# Patient Record
Sex: Male | Born: 1980 | Race: Black or African American | Hispanic: No | Marital: Married | Smoking: Never smoker
Health system: Southern US, Community
[De-identification: ages and names within clinical notes are randomized; demographics above are authoritative.]

---

## 2012-10-23 ENCOUNTER — Ambulatory Visit (INDEPENDENT_AMBULATORY_CARE_PROVIDER_SITE_OTHER): Payer: PRIVATE HEALTH INSURANCE | Admitting: Emergency Medicine

## 2012-10-23 VITALS — BP 134/82 | HR 103 | Temp 101.2°F | Resp 16 | Ht 70.5 in | Wt 187.0 lb

## 2012-10-23 DIAGNOSIS — A088 Other specified intestinal infections: Secondary | ICD-10-CM

## 2012-10-23 LAB — POCT CBC
Granulocyte percent: 79.2 %G (ref 37–80)
MCH, POC: 28 pg (ref 27–31.2)
MID (cbc): 0.5 (ref 0–0.9)
MPV: 7.3 fL (ref 0–99.8)
POC MID %: 7.5 %M (ref 0–12)
Platelet Count, POC: 241 10*3/uL (ref 142–424)
RBC: 4.92 M/uL (ref 4.69–6.13)
RDW, POC: 12.9 %

## 2012-10-23 MED ORDER — ONDANSETRON 8 MG PO TBDP: 8.0000 mg | ORAL_TABLET | Freq: Three times a day (TID) | ORAL | Status: AC | PRN

## 2012-10-23 NOTE — Progress Notes (Signed)
[facility] [address], [place] [address] [phone]  Date:  10/23/2012   Name:  Steven Hays   DOB:  07-May-1981   MRN:  [id number]  PCP:  No primary provider on file.    Chief Complaint: Illness   History of Present Illness:  Steven Hays is a 32 y.o. very pleasant male patient who presents with the following:  Ill this afternoon with chills and fever and a sensation that he is going to move his bowels and have diarrhea and nausea.  No vomiting.  Wife was ill with same last week.  No rash. No cough or coryza.  No GU symptoms.  No improvement with over the counter medications or other home remedies. Denies other complaint or health concern today.   There is no problem list on file for this patient.   History reviewed. No pertinent past medical history.  History reviewed. No pertinent past surgical history.  History  Substance Use Topics  . Smoking status: Never Smoker   . Smokeless tobacco: Not on file  . Alcohol Use: No    Family History  Problem Relation Age of Onset  . Hypertension Father     No Known Allergies  Medication list has been reviewed and updated.  No current outpatient prescriptions on file prior to visit.   No current facility-administered medications on file prior to visit.    Review of Systems:  As per HPI, otherwise negative.    Physical Examination: Filed Vitals:   10/23/12 2033  BP: 134/82  Pulse: 103  Temp: 101.2 F (38.4 C)  Resp: 16   Filed Vitals:   10/23/12 2033  Height: 5' 10.5" (1.791 m)  Weight: 187 lb (84.823 kg)   Body mass index is 26.44 kg/(m^2). Ideal Body Weight: Weight in (lb) to have BMI = 25: 176.4  GEN: WDWN, NAD, Non-toxic, A & O x 3 HEENT: Atraumatic, Normocephalic. Neck supple. No masses, No LAD. Ears and Nose: No external deformity. CV: RRR, No M/G/R. No JVD. No thrill. No extra heart sounds. PULM: CTA B, no wheezes, crackles, rhonchi. No retractions. No resp. distress.  No accessory muscle use. ABD: S, NT, ND, +BS. No rebound. No HSM. EXTR: No c/c/e NEURO Normal gait.  PSYCH: Normally interactive. Conversant. Not depressed or anxious appearing.  Calm demeanor.    Assessment and Plan: Gastroenteritis Imodium zofran  Signed,  Phillips Odor, MD   Results for orders placed in visit on 10/23/12  POCT CBC      Result Value Range   WBC 6.2  4.6 - 10.2 K/uL   Lymph, poc 0.8  0.6 - 3.4   POC LYMPH PERCENT 13.3  10 - 50 %L   MID (cbc) 0.5  0 - 0.9   POC MID % 7.5  0 - 12 %M   POC Granulocyte 4.9  2 - 6.9   Granulocyte percent 79.2  37 - 80 %G   RBC 4.92  4.69 - 6.13 M/uL   Hemoglobin 13.8 (*) 14.1 - 18.1 g/dL   HCT, POC 29.5 (*) 62.1 - 53.7 %   MCV 86.1  80 - 97 fL   MCH, POC 28.0  27 - 31.2 pg   MCHC 32.5  31.8 - 35.4 g/dL   RDW, POC 30.8     Platelet Count, POC 241  142 - 424 K/uL   MPV 7.3  0 - 99.8 fL

## 2012-10-23 NOTE — Patient Instructions (Signed)
Clear Liquid Diet °The clear liquid diet consists of foods that are liquid or will become liquid at room temperature. You should be able to see through the liquid and beverages. Examples of foods allowed on a clear liquid diet include fruit juice, broth or bouillon, gelatin, or frozen ice pops. °The purpose of this diet is to provide necessary fluid, electrolytes such as sodium and potassium, and energy to keep the body functioning during times when you are not able to consume a regular diet. A clear liquid diet should not be continued for long periods of time as it is not nutritionally adequate.  °REASONS FOR USING A CLEAR LIQUID DIET °· In sudden onset (acute) conditions for a patient before or after surgery. °· As the first step in oral feeding. °· For fluid and electrolyte replacement in diarrheal diseases. °· As a diet before certain medical tests are performed. °ADEQUACY °The clear liquid diet is adequate only in ascorbic acid, according to the Recommended Dietary Allowances of the National Research Council. °CHOOSING FOODS °Breads and Starches °· Allowed:  None are allowed. °· Avoid: All are avoided. °Vegetables °· Allowed:  Strained tomato or vegetable juice. °· Avoid: Any others. °Fruit °· Allowed:  Strained fruit juices and fruit drinks. Include 1 serving of citrus or vitamin C-enriched fruit juice daily. °· Avoid: Any others. °Meat and Meat Substitutes °· Allowed:  None are allowed. °· Avoid: All are avoided. °Milk °· Allowed:  None are allowed. °· Avoid: All are avoided. °Soups and Combination Foods °· Allowed:  Clear bouillon, broth, or strained broth-based soups. °· Avoid: Any others. °Desserts and Sweets °· Allowed:  Sugar, honey. High protein gelatin. Flavored gelatin, ices, or frozen ice pops that do not contain milk. °· Avoid: Any others. °Fats and Oils °· Allowed:  None are allowed. °· Avoid: All are avoided. °Beverages °· Allowed: Cereal beverages, coffee (regular or decaffeinated), tea, or soda  at the discretion of your caregiver. °· Avoid: Any others. °Condiments °· Allowed:  Iodized salt. °· Avoid: Any others, including pepper. °Supplements °· Allowed:  Liquid nutrition beverages. °· Avoid: Any others that contain lactose or fiber. °SAMPLE MEAL PLAN °Breakfast °· 4 oz (120 mL) strained orange juice. °· ½ to 1 cup (125 to 250 mL) gelatin (plain or fortified). °· 1 cup (250 mL) beverage (coffee or tea). °· Sugar, if desired. °Midmorning Snack °· ½ cup (125 mL) gelatin (plain or fortified). °Lunch °· 1 cup (250 mL) broth or consommé. °· 4 oz (120 mL) strained grapefruit juice. °· ½ cup (125 mL) gelatin (plain or fortified). °· 1 cup (250 mL) beverage (coffee or tea). °· Sugar, if desired. °Midafternoon Snack °· ½ cup (125 mL) fruit ice. °· ½ cup (125 mL) strained fruit juice. °Dinner °· 1 cup (250 mL) broth or consommé. °· ½ cup (125 mL) cranberry juice. °· ½ cup (125 mL) flavored gelatin (plain or fortified). °· 1 cup (250 mL) beverage (coffee or tea). °· Sugar, if desired. °Evening Snack °· 4 oz (120 mL) strained apple juice (vitamin C-fortified). °· ½ cup (125 mL) flavored gelatin (plain or fortified). °Document Released: 07/25/2005 Document Revised: 10/17/2011 Document Reviewed: 10/22/2010 °[employer]® Patient Information ©2013 [employer]. ° °

## 2013-05-28 ENCOUNTER — Other Ambulatory Visit: Payer: Self-pay | Admitting: Family Medicine

## 2013-05-28 DIAGNOSIS — E010 Iodine-deficiency related diffuse (endemic) goiter: Secondary | ICD-10-CM

## 2013-06-05 ENCOUNTER — Other Ambulatory Visit: Payer: Self-pay

## 2013-06-12 ENCOUNTER — Other Ambulatory Visit: Payer: Self-pay

## 2014-06-10 ENCOUNTER — Other Ambulatory Visit: Payer: Self-pay | Admitting: Emergency Medicine

## 2014-06-10 LAB — MEASLES/MUMPS/RUBELLA IMMUNITY
Mumps IgG: 239 AU/mL — ABNORMAL HIGH (ref <9.00)
RUBELLA: 7.93 {index} — AB (ref <0.90)
Rubeola IgG: >300 AU/mL — ABNORMAL HIGH (ref <25.00)

## 2014-06-10 LAB — VARICELLA ZOSTER ANTIBODY, IGG: VARICELLA IGG: 520.7 {index} — AB (ref <135.00)

## 2014-06-19 ENCOUNTER — Encounter: Payer: Self-pay | Admitting: *Deleted

## 2015-07-09 ENCOUNTER — Other Ambulatory Visit: Payer: Self-pay | Admitting: Family

## 2015-07-09 DIAGNOSIS — G4489 Other headache syndrome: Secondary | ICD-10-CM

## 2015-07-22 ENCOUNTER — Ambulatory Visit
Admission: RE | Admit: 2015-07-22 | Discharge: 2015-07-22 | Disposition: A | Discharge status: Home / Self Care | Payer: 59 | Source: Ambulatory Visit | Attending: Family | Admitting: Family

## 2015-07-22 DIAGNOSIS — G4489 Other headache syndrome: Secondary | ICD-10-CM

## 2015-08-31 ENCOUNTER — Ambulatory Visit: Payer: 59 | Admitting: Neurology

## 2015-09-28 ENCOUNTER — Ambulatory Visit: Payer: 59 | Admitting: Neurology

## 2018-06-21 ENCOUNTER — Ambulatory Visit (HOSPITAL_COMMUNITY)
Admission: EM | Admit: 2018-06-21 | Discharge: 2018-06-21 | Disposition: A | Discharge status: Home / Self Care | Payer: PRIVATE HEALTH INSURANCE | Attending: Family Medicine | Admitting: Family Medicine

## 2018-06-21 ENCOUNTER — Ambulatory Visit (INDEPENDENT_AMBULATORY_CARE_PROVIDER_SITE_OTHER): Payer: PRIVATE HEALTH INSURANCE

## 2018-06-21 ENCOUNTER — Encounter (HOSPITAL_COMMUNITY): Payer: Self-pay | Admitting: Emergency Medicine

## 2018-06-21 DIAGNOSIS — R0602 Shortness of breath: Secondary | ICD-10-CM

## 2018-06-21 MED ORDER — HYDROXYZINE HCL 25 MG PO TABS: 25.0000 mg | ORAL_TABLET | Freq: Every day | ORAL | 0 refills | Status: AC

## 2018-06-21 MED ORDER — OMEPRAZOLE 20 MG PO CPDR: 20.0000 mg | DELAYED_RELEASE_CAPSULE | Freq: Every day | ORAL | 0 refills | Status: AC

## 2018-06-21 NOTE — ED Triage Notes (Signed)
Pt c/o sob x1 month. Denies hx of allergies, asthma.

## 2018-06-21 NOTE — ED Provider Notes (Signed)
[facility]    CSN: [id number] Arrival date & time: 06/21/18  1859     History   Chief Complaint Chief Complaint  Patient presents with  . Shortness of Breath    HPI Steven Hays is a 37 y.o. male.   Steven Hays presents with complaints of shortness of breath for the past month. Was intermittent but has become more persistent. Worse if lays flat or on his side, but then once he is asleep he will sleep laying flat without difficulty. No cough. Feels he needs to take a deep breath. No specific back or chest pain although sometimes notices some sternal pain. No URI symptoms. No fevers. Has been able to work out without difficulty, doesn't become shortness of breath. No palpitations. No dizziness. Hasn't taken any medications for symptoms. No history of asthma. Doesn't smoke. Denies any previous similar. No leg pain or swelling, no edema. He take Steven Hays 100mg  bid for folliculitis prescribed by derm. Without contributing medical history.      ROS per HPI.      History reviewed. No pertinent past medical history.  There are no active problems to display for this patient.   History reviewed. No pertinent surgical history.     Home Medications    Prior to Admission medications   Medication Sig Start Date End Date Taking? Authorizing Provider  Steven Hays (VIBRAMYCIN) 100 MG capsule Take 100 mg by mouth 2 (two) times daily.   Yes [provider]  hydrOXYzine (ATARAX/VISTARIL) 25 MG tablet Take 1 tablet (25 mg total) by mouth at bedtime. 06/21/18   Georgetta HaberBurky, Kyri Dai B, NP  omeprazole (PRILOSEC) 20 MG capsule Take 1 capsule (20 mg total) by mouth daily. 06/21/18   Georgetta HaberBurky, Beryle Zeitz B, NP  ondansetron (ZOFRAN-ODT) 8 MG disintegrating tablet Take 1 tablet (8 mg total) by mouth every 8 (eight) hours as needed for nausea. Patient not taking: Reported on 06/21/2018 10/23/12   Carmelina DaneAnderson, Jeffery S, MD    Family History Family History  Problem Relation Age of Onset  .  Hypertension Father     Social History Social History   Tobacco Use  . Smoking status: Never Smoker  Substance Use Topics  . Alcohol use: No  . Drug use: No     Allergies   Patient has no known allergies.   Review of Systems Review of Systems   Physical Exam Triage Vital Signs ED Triage Vitals  Enc Vitals Group     BP 06/21/18 1946 (!) 150/86     Pulse Rate 06/21/18 1946 80     Resp 06/21/18 1946 16     Temp 06/21/18 1946 98 F (36.7 C)     Temp src --      SpO2 06/21/18 1946 100 %     Weight --      Height --      Head Circumference --      Peak Flow --      Pain Score 06/21/18 1948 0     Pain Loc --      Pain Edu? --      Excl. in GC? --    No data found.  Updated Vital Signs BP (!) 150/86   Pulse 80   Temp 98 F (36.7 C)   Resp 16   SpO2 100%    Physical Exam  Constitutional: He is oriented to person, place, and time. He appears well-developed and well-nourished.  Cardiovascular: Normal rate, regular rhythm, normal heart sounds and normal pulses.  Pulmonary/Chest: Effort normal and breath sounds normal.  Lymphadenopathy:  No peripheral edema   Neurological: He is alert and oriented to person, place, and time.  Skin: Skin is warm and dry.   EKG NSR Rate of 71. Abnormal stnoted to v1 btu without any acute changes   UC Treatments / Results  Labs (all labs ordered are listed, but only abnormal results are displayed) Labs Reviewed - No data to display  EKG None  Radiology Dg Chest 2 View  Result Date: 06/21/2018 CLINICAL DATA:  Short of breath EXAM: CHEST - 2 VIEW COMPARISON:  None. FINDINGS: The heart size and mediastinal contours are within normal limits. Both lungs are clear. The visualized skeletal structures are unremarkable. IMPRESSION: No active cardiopulmonary disease. Electronically Signed   By: Marlan Palau M.D.   On: 06/21/2018 20:33    Procedures Procedures (including critical care time)  Medications Ordered in  UC Medications - No data to display  Initial Impression / Assessment and Plan / UC Course  I have reviewed the triage vital signs and the nursing notes.  Pertinent labs & imaging results that were available during my care of the patient were reviewed by me and considered in my medical decision making (see chart for details).     Non toxic, no increased work of breathing. No cardiac or PE risk factors. No tachycardia or hypoxia. Symptoms for 1 month. Exercising without difficulty which is reassuring. Worse with laying flat and occasional sternal pain. Anxiety vs gerd considered in differentials. Will start with daily omeprazole and use of vistaril at night. Recommend follow up with PCP to establish care and for follow up. Return precautions provided. Patient verbalized understanding and agreeable to plan.  Ambulatory out of clinic without difficulty.   Final Clinical Impressions(s) / UC Diagnoses   Final diagnoses:  SOB (shortness of breath)     Discharge Instructions     Your ekg and chest xray are reassuring here tonight.  I have low suspicion of heart or lung etiology.  I still would like you to follow up with a primary care provider for recheck in the next two weeks especially if symptoms persist.  Reflux and/or anxiety can both contribute so similar symptoms. Please start daily omeprazole. May use hydroxyzine at night.  If develop worsening of shortness of breath , chest pain, difficulty breathing, fevers, or otherwise worsening please return or go to the Er.     ED Prescriptions    Medication Sig Dispense Auth. Provider   omeprazole (PRILOSEC) 20 MG capsule Take 1 capsule (20 mg total) by mouth daily. 30 capsule Linus Mako B, NP   hydrOXYzine (ATARAX/VISTARIL) 25 MG tablet Take 1 tablet (25 mg total) by mouth at bedtime. 12 tablet Georgetta Haber, NP     Controlled Substance Prescriptions [place] Controlled Substance Registry consulted? Not Applicable   Georgetta Haber,  NP 06/21/18 2038

## 2018-06-21 NOTE — Discharge Instructions (Signed)
Your ekg and chest xray are reassuring here tonight.  I have low suspicion of heart or lung etiology.  I still would like you to follow up with a primary care provider for recheck in the next two weeks especially if symptoms persist.  Reflux and/or anxiety can both contribute so similar symptoms. Please start daily omeprazole. May use hydroxyzine at night.  If develop worsening of shortness of breath , chest pain, difficulty breathing, fevers, or otherwise worsening please return or go to the Er.

## 2020-03-30 IMAGING — DX DG CHEST 2V
2 series · 2 of 2 positions shown · non-contrast
Comparison: None.

CLINICAL DATA: Short of breath

EXAM:
CHEST - 2 VIEW

[chest pa]
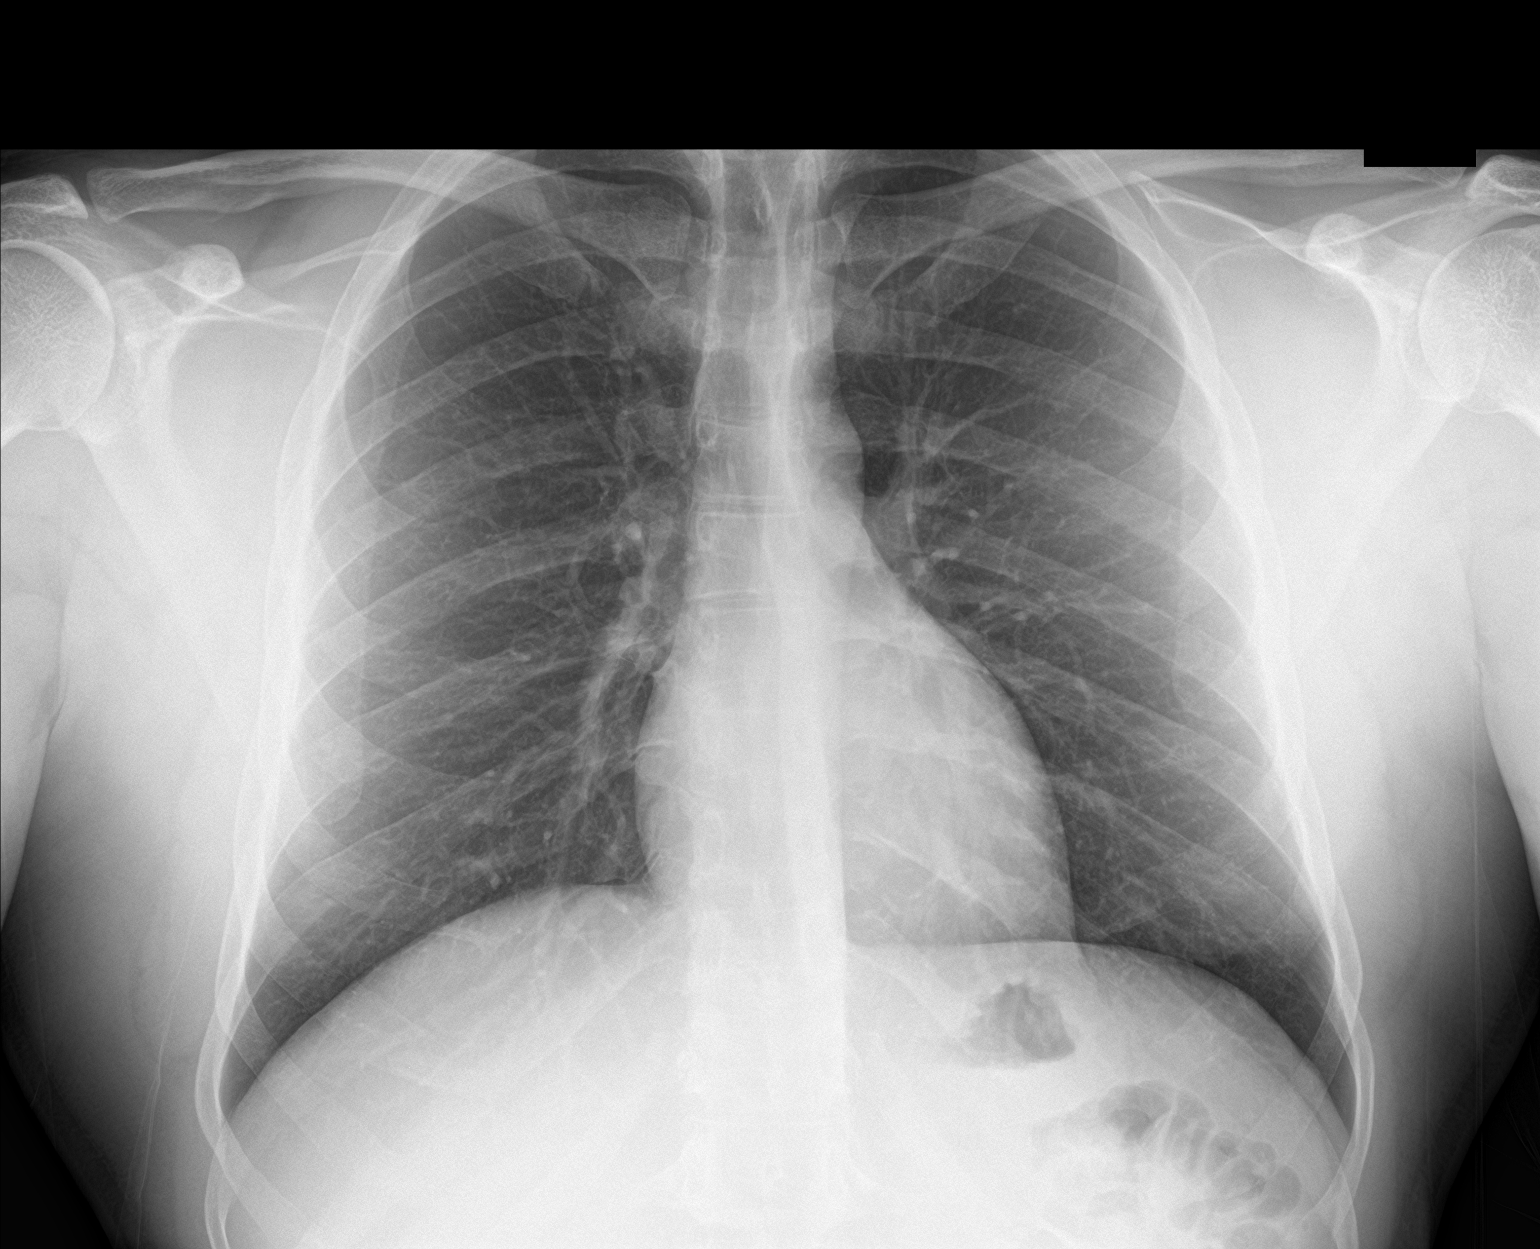

[chest lat]
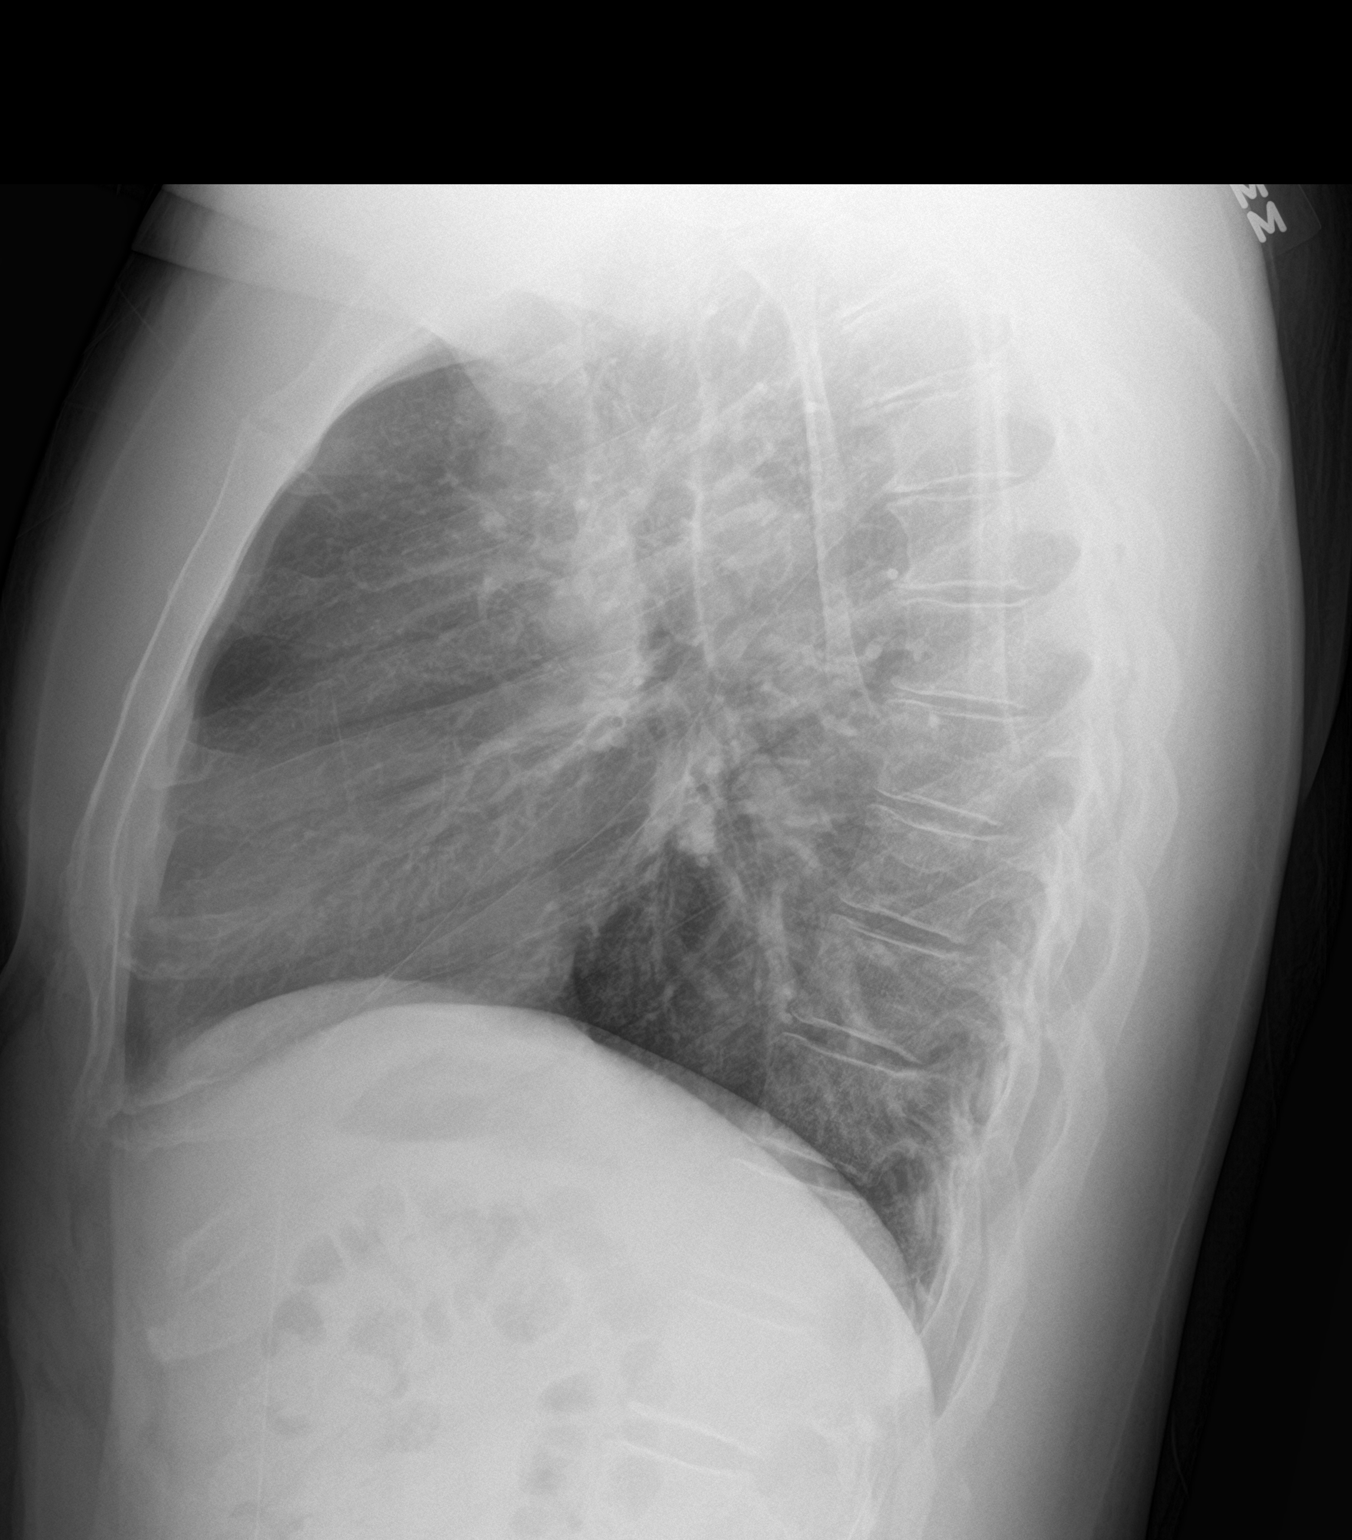

[2 of 2 positions shown; findings below may reference images not displayed]

FINDINGS: The heart size and mediastinal contours are within normal limits.
Both lungs are clear. The visualized skeletal structures are
unremarkable.
IMPRESSION: No active cardiopulmonary disease.
